# Patient Record
Sex: Male | Born: 1965 | Hispanic: Yes | Marital: Married | Smoking: Never smoker
Health system: Southern US, Community
[De-identification: ages and names within clinical notes are randomized; demographics above are authoritative.]

## PROBLEM LIST (undated history)

## (undated) DIAGNOSIS — Q249 Congenital malformation of heart, unspecified: Secondary | ICD-10-CM

## (undated) DIAGNOSIS — E119 Type 2 diabetes mellitus without complications: Secondary | ICD-10-CM

## (undated) HISTORY — PX: NO PAST SURGERIES: SHX2092

## (undated) HISTORY — DX: Type 2 diabetes mellitus without complications: E11.9

## (undated) HISTORY — DX: Congenital malformation of heart, unspecified: Q24.9

---

## 2009-04-07 DIAGNOSIS — Q249 Congenital malformation of heart, unspecified: Secondary | ICD-10-CM

## 2009-04-07 HISTORY — DX: Congenital malformation of heart, unspecified: Q24.9

## 2009-05-13 ENCOUNTER — Emergency Department: Payer: Self-pay | Admitting: Internal Medicine

## 2009-07-08 ENCOUNTER — Inpatient Hospital Stay: Payer: Self-pay | Admitting: Internal Medicine

## 2010-09-18 IMAGING — CR DG CHEST 1V PORT
1 series · 1 of 1 positions shown · non-contrast
Comparison: none

REASON FOR EXAM: Chest Pain
COMMENTS:

PROCEDURE:     DXR - DXR PORTABLE CHEST SINGLE VIEW  - July 08, 2009 [DATE]
RESULT:     Comparison: None

[view not recorded]
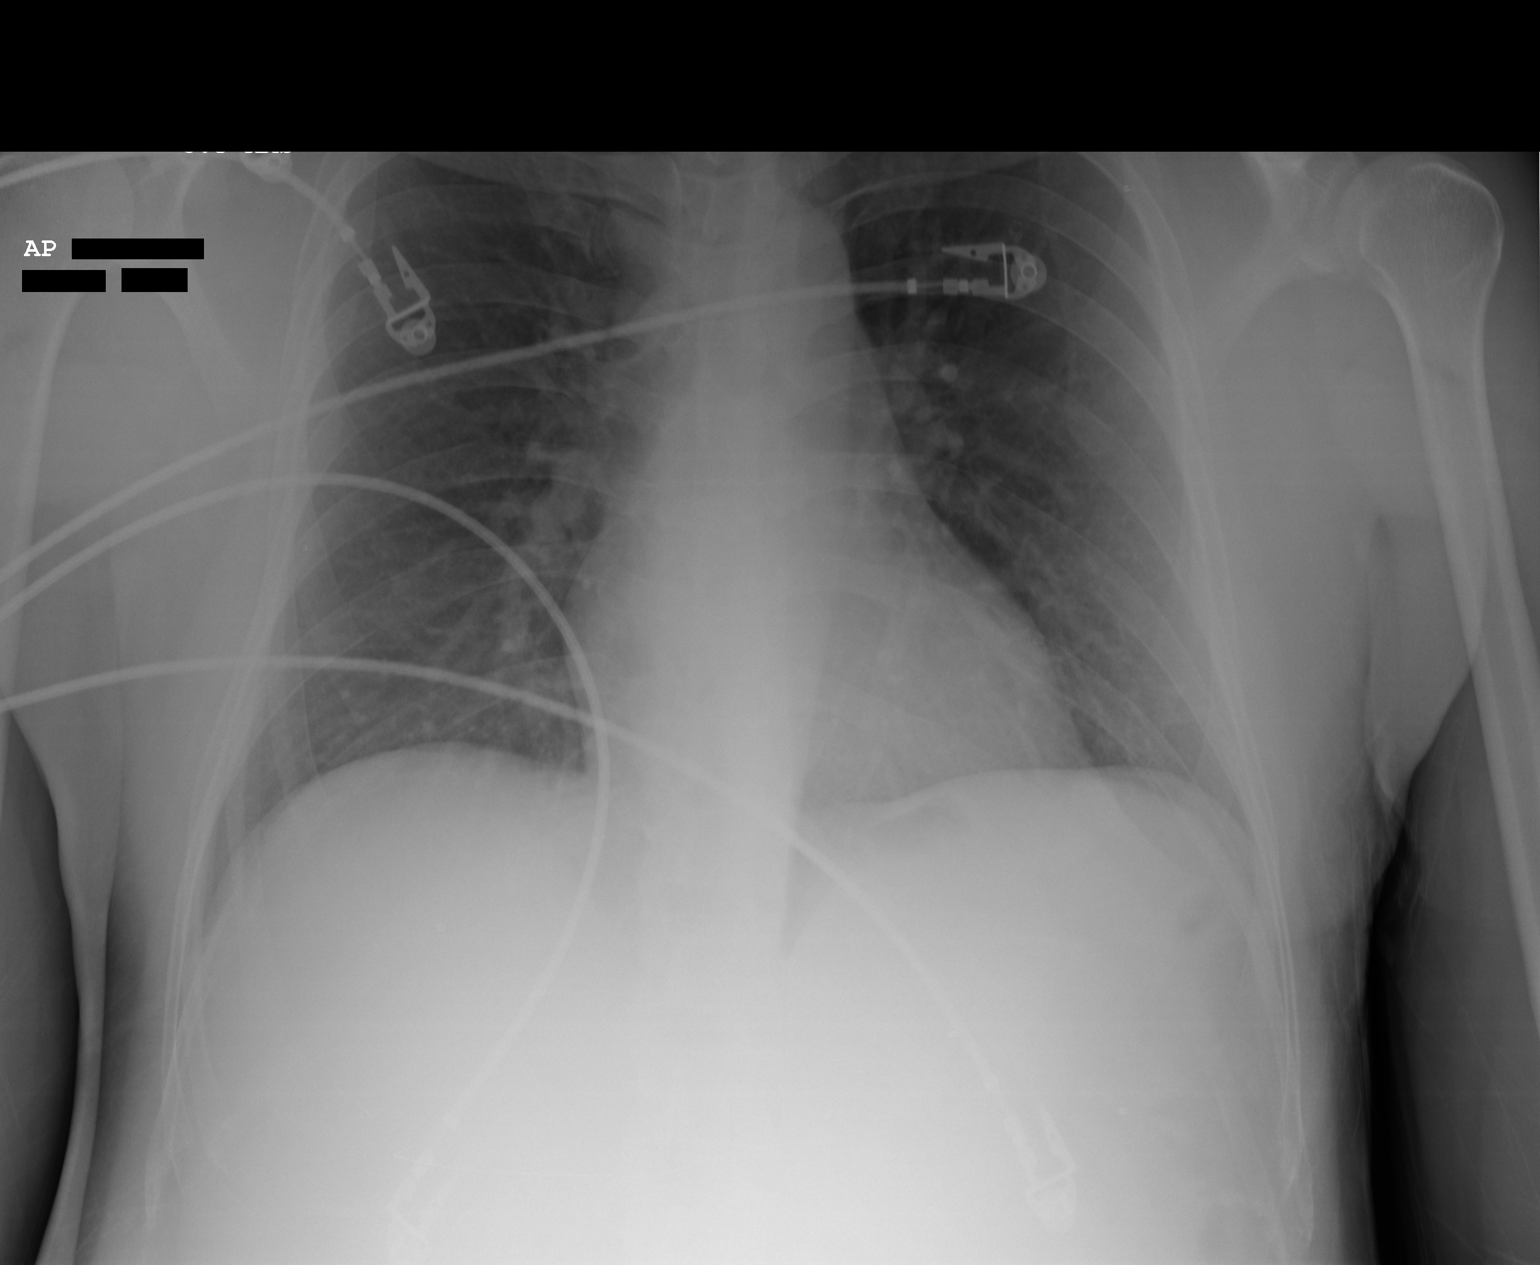

[1 of 1 positions shown; findings below may reference images not displayed]

FINDINGS: Single portable AP chest radiograph is provided. There is no focal
parenchymal opacity, pleural effusion, or pneumothorax. Normal
cardiomediastinal silhouette. The osseous structures are unremarkable.
IMPRESSION: No acute disease of the chest.

## 2014-07-27 DIAGNOSIS — I2510 Atherosclerotic heart disease of native coronary artery without angina pectoris: Secondary | ICD-10-CM | POA: Insufficient documentation

## 2014-07-27 DIAGNOSIS — E785 Hyperlipidemia, unspecified: Secondary | ICD-10-CM | POA: Insufficient documentation

## 2014-07-27 DIAGNOSIS — E119 Type 2 diabetes mellitus without complications: Secondary | ICD-10-CM | POA: Insufficient documentation

## 2017-06-30 ENCOUNTER — Ambulatory Visit: Payer: Self-pay | Admitting: Cardiovascular Disease

## 2017-07-01 ENCOUNTER — Encounter: Payer: Self-pay | Admitting: Cardiovascular Disease

## 2017-12-24 ENCOUNTER — Ambulatory Visit: Payer: BLUE CROSS/BLUE SHIELD | Admitting: Family Medicine

## 2017-12-24 ENCOUNTER — Encounter: Payer: Self-pay | Admitting: Family Medicine

## 2017-12-24 VITALS — BP 130/80 | HR 74 | Temp 98.3°F | Resp 16 | Ht 64.0 in | Wt 140.1 lb

## 2017-12-24 DIAGNOSIS — E785 Hyperlipidemia, unspecified: Secondary | ICD-10-CM | POA: Diagnosis not present

## 2017-12-24 DIAGNOSIS — I2510 Atherosclerotic heart disease of native coronary artery without angina pectoris: Secondary | ICD-10-CM | POA: Diagnosis not present

## 2017-12-24 DIAGNOSIS — Z125 Encounter for screening for malignant neoplasm of prostate: Secondary | ICD-10-CM

## 2017-12-24 DIAGNOSIS — E118 Type 2 diabetes mellitus with unspecified complications: Secondary | ICD-10-CM

## 2017-12-24 DIAGNOSIS — Z1212 Encounter for screening for malignant neoplasm of rectum: Secondary | ICD-10-CM

## 2017-12-24 DIAGNOSIS — Z114 Encounter for screening for human immunodeficiency virus [HIV]: Secondary | ICD-10-CM

## 2017-12-24 DIAGNOSIS — R109 Unspecified abdominal pain: Secondary | ICD-10-CM | POA: Diagnosis not present

## 2017-12-24 DIAGNOSIS — Z1159 Encounter for screening for other viral diseases: Secondary | ICD-10-CM

## 2017-12-24 DIAGNOSIS — Z1211 Encounter for screening for malignant neoplasm of colon: Secondary | ICD-10-CM

## 2017-12-24 DIAGNOSIS — R140 Abdominal distension (gaseous): Secondary | ICD-10-CM | POA: Diagnosis not present

## 2017-12-24 NOTE — Progress Notes (Signed)
Name: Dalton Allen   MRN: [id number]    DOB: 03/21/1966   Date:12/24/2017       Progress Note  Subjective  Chief Complaint  Chief Complaint  Patient presents with  . Establish Care  . Abdominal Pain    bloated and pain    HPI  PT presents to establish care and for the following concerns:  He is spanish speaking only, and an interpreter is present for the entirety of the visit.  Diabetes mellitus type 2 - HE was diagnosed about 10 years ago, was put on medication initially, but controlled with diet and exercise.  Has not had A1C in several years Checking sugars?  no How often? N/A Range (low to high) over last two weeks:  N/A Does patient feel additional teaching/training would be helpful?  no  Have they attended Diabetes education classes? no  Trying to limit white bread, white rice, white potatoes, sweets?  yes Trying to limit sweetened drinks like iced tea, soft drinks, sports drinks, fruit juices?  yes Checking feet every day/night?  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 Most recent A1C: No results found for: HGBA1C  We will recheck today. Last CMP Results : is due today No results found for: NA, K, CL, CO2, GLUCOSE, BUN, CREATININE, CALCIUM, PROT, ALBUMIN, AST, ALT, ALKPHOS, BILITOT, GFRNONAA, GFRAA Urine Micro UTD? No Current Medication Management: Diabetic Medications:  ACEI/ARB: No Statin: No Aspirin therapy: No  Hyperlipidemia: Current Medication Regimen: Last Lipids: No results found for: CHOL, HDL, LDLCALC, LDLDIRECT, TRIG, CHOLHDL - Current Diet: - Denies: Chest pain, shortness of breath, myalgias. - Documented aortic atherosclerosis? No - Risk factors for atherosclerosis: diabetes mellitus and hypercholesterolemia  CAD: He has a noted history of CAD,  has not seen any provider since 2012.  He is not taking any medications.  HE denies chest pain or shortness of breath now.  He notes that back in 2012 he was seeing a cardiologist because he was having intermittent chest pain, but since then, he has not been having chest pain.  It appears he saw Dr. Darrold JunkerParaschos on 07/28/2014, an echocardiogram was recommended but it does not appear that this was performed.  Pt notes he may have had a cardiac cath in the past but is unsure.  Abdominal Pain: He went to [place] 2 months ago, ate some different foods, and had some abdominal pain - took some OTC medication that caused abdominal pain.  When he came back to the [place] the abdominal pain went away, but he is still having some bloating and gas.  He is overdue for colonoscopy - we will refer today and check for H. Pylori.  Health Maintenance:  - HIV & Hep C - to be ordered - PNA Vaccine - declines - Flu vaccine - declines - TDAP vaccine - declines - Colorectal cancer screening: We will order today - Prostate Cancer Screening: We will order today  Patient Active Problem List   Diagnosis Date Noted  . CAD (coronary artery disease) 07/27/2014  . Diabetes (HCC) 07/27/2014  . Hyperlipidemia 07/27/2014    Past Surgical History:  Procedure Laterality Date  . NO PAST SURGERIES      Family History  Problem Relation Age of Onset  . Diabetes Mother  Mother's side has +DM history  . Colon cancer Father 32    Social History   Socioeconomic History  . Marital status: Married    Spouse name: Johna Sheriff  . Number of children: 2  . Years of education: Not on file  . Highest education level: Not on file  Occupational History  . Occupation: warehoused    Employer: [employer]  Social Needs  . Financial resource strain: Not hard at all  . Food insecurity:    Worry: Never true    Inability: Never true  . Transportation needs:    Medical: No    Non-medical: No  Tobacco Use  . Smoking  status: Never Smoker  . Smokeless tobacco: Former Engineer, water and Sexual Activity  . Alcohol use: Never    Frequency: Never  . Drug use: Never  . Sexual activity: Yes  Lifestyle  . Physical activity:    Days per week: 0 days    Minutes per session: 0 min  . Stress: Not at all  Relationships  . Social connections:    Talks on phone: More than three times a week    Gets together: More than three times a week    Attends religious service: More than 4 times per year    Active member of club or organization: No    Attends meetings of clubs or organizations: Never    Relationship status: Married  . Intimate partner violence:    Fear of current or ex partner: No    Emotionally abused: No    Physically abused: No    Forced sexual activity: No  Other Topics Concern  . Not on file  Social History Narrative  . Not on file    No current outpatient medications on file.  No Known Allergies  I personally reviewed active problem list, medication list, allergies, family history, social history, health maintenance with the patient/caregiver today.   ROS Constitutional: Negative for fever or weight change.  Respiratory: Negative for cough and shortness of breath.   Cardiovascular: Negative for chest pain or palpitations.  Gastrointestinal: Negative for abdominal pain, no bowel changes.  Musculoskeletal: Negative for gait problem or joint swelling.  Skin: Negative for rash.  Neurological: Negative for dizziness or headache.  No other specific complaints in a complete review of systems (except as listed in HPI above).  Objective  Vitals:   12/24/17 1008  BP: 130/80  Pulse: 74  Resp: 16  Temp: 98.3 F (36.8 C)  TempSrc: Oral  SpO2: 99%  Weight: 140 lb 1.6 oz (63.5 kg)  Height: 5\' 4"  (1.626 m)   Body mass index is 24.05 kg/m.  Physical Exam Constitutional: Patient appears well-developed and well-nourished. No distress.  HENT: Head: Normocephalic and atraumatic. Ears:  bilateral TMs with no erythema or effusion; Nose: Nose normal. Mouth/Throat: Oropharynx is clear and moist. No oropharyngeal exudate or tonsillar swelling.  Eyes: Conjunctivae and EOM are normal. No scleral icterus.  Pupils are equal, round, and reactive to light.  Neck: Normal range of motion. Neck supple. No JVD present. No thyromegaly present.  Cardiovascular: Normal rate, regular rhythm and normal heart sounds.  No murmur heard. No BLE edema. Pulmonary/Chest: Effort normal and breath sounds normal. No respiratory distress. Abdominal: Soft. Bowel sounds are normal, no distension. There is no tenderness. No masses. Musculoskeletal: Normal range of motion, no joint effusions. No gross deformities Neurological: Pt is alert and oriented to person, place, and time. No cranial nerve deficit. Coordination, balance, strength,  speech and gait are normal.  Skin: Skin is warm and dry. No rash noted. No erythema.  Psychiatric: Patient has a normal mood and affect. behavior is normal. Judgment and thought content normal.  No results found for this or any previous visit (from the past 72 hour(s)).  Diabetic Foot Exam: Diabetic Foot Exam - Simple   Simple Foot Form Diabetic Foot exam was performed with the following findings:  Yes 12/24/2017  2:50 PM  Visual Inspection No deformities, no ulcerations, no other skin breakdown bilaterally:  Yes Sensation Testing Intact to touch and monofilament testing bilaterally:  Yes Pulse Check Posterior Tibialis and Dorsalis pulse intact bilaterally:  Yes Comments    PHQ2/9:  Deferred today, he states is doing well and has no concerns for depression or anxiety today.  Assessment & Plan  1. Coronary artery disease involving native heart without angina pectoris, unspecified vessel or lesion type - Lipid panel  2. Type 2 diabetes mellitus with complication, without long-term current use of insulin (HCC) - COMPLETE METABOLIC PANEL WITH GFR - Hemoglobin A1c -  Ambulatory referral to Ophthalmology - Urine Microalbumin w/creat. Ratio - Discussed importance of 150 minutes of physical activity weekly, eat two servings of fish weekly, eat one serving of tree nuts ( cashews, pistachios, pecans, almonds.Marland Kitchen) every other day, eat 6 servings of fruit/vegetables daily and drink plenty of water and avoid sweet beverages.   3. Hyperlipidemia, unspecified hyperlipidemia type - Lipid panel  4. Abdominal discomfort - COMPLETE METABOLIC PANEL WITH GFR - CBC - H. pylori breath test  5. Bloating - H. pylori breath test  6. Need for hepatitis C screening test - Hepatitis C antibody  7. Encounter for screening for HIV - HIV Antibody (routine testing w rflx)  8. Screening for colorectal cancer - Ambulatory referral to Gastroenterology  9. Prostate cancer screening - PSA

## 2017-12-25 ENCOUNTER — Other Ambulatory Visit: Payer: Self-pay | Admitting: Family Medicine

## 2017-12-25 DIAGNOSIS — E782 Mixed hyperlipidemia: Secondary | ICD-10-CM

## 2017-12-25 LAB — COMPLETE METABOLIC PANEL WITH GFR
AG Ratio: 1.5 (calc) (ref 1.0–2.5)
ALT: 33 U/L (ref 9–46)
AST: 23 U/L (ref 10–35)
Albumin: 4.4 g/dL (ref 3.6–5.1)
Alkaline phosphatase (APISO): 97 U/L (ref 40–115)
BILIRUBIN TOTAL: 0.5 mg/dL (ref 0.2–1.2)
BUN: 17 mg/dL (ref 7–25)
CALCIUM: 9.4 mg/dL (ref 8.6–10.3)
CHLORIDE: 101 mmol/L (ref 98–110)
CO2: 30 mmol/L (ref 20–32)
Creat: 0.91 mg/dL (ref 0.70–1.33)
GFR, EST NON AFRICAN AMERICAN: 96 mL/min/{1.73_m2} (ref >=60)
GFR, Est African American: 111 mL/min/{1.73_m2} (ref >=60)
GLUCOSE: 93 mg/dL (ref 65–99)
Globulin: 2.9 g/dL (calc) (ref 1.9–3.7)
Potassium: 4.3 mmol/L (ref 3.5–5.3)
Sodium: 139 mmol/L (ref 135–146)
Total Protein: 7.3 g/dL (ref 6.1–8.1)

## 2017-12-25 LAB — LIPID PANEL
CHOL/HDL RATIO: 4.6 (calc) (ref <5.0)
Cholesterol: 162 mg/dL (ref <200)
HDL: 35 mg/dL — ABNORMAL LOW (ref >40)
LDL Cholesterol (Calc): 105 mg/dL (calc) — ABNORMAL HIGH
NON-HDL CHOLESTEROL (CALC): 127 mg/dL (ref <130)
Triglycerides: 123 mg/dL (ref <150)

## 2017-12-25 LAB — CBC
HCT: 47.6 % (ref 38.5–50.0)
Hemoglobin: 16.2 g/dL (ref 13.2–17.1)
MCH: 29.3 pg (ref 27.0–33.0)
MCHC: 34 g/dL (ref 32.0–36.0)
MCV: 86.2 fL (ref 80.0–100.0)
MPV: 10.8 fL (ref 7.5–12.5)
Platelets: 266 10*3/uL (ref 140–400)
RBC: 5.52 10*6/uL (ref 4.20–5.80)
RDW: 12.5 % (ref 11.0–15.0)
WBC: 5.4 10*3/uL (ref 3.8–10.8)

## 2017-12-25 LAB — H. PYLORI BREATH TEST: H. pylori Breath Test: NOT DETECTED

## 2017-12-25 LAB — MICROALBUMIN / CREATININE URINE RATIO
Creatinine, Urine: 164 mg/dL (ref 20–320)
MICROALB UR: 0.3 mg/dL
MICROALB/CREAT RATIO: 2 ug/mg{creat} (ref <30)

## 2017-12-25 LAB — HEPATITIS C ANTIBODY
HEP C AB: NONREACTIVE
SIGNAL TO CUT-OFF: 0.02 (ref <1.00)

## 2017-12-25 LAB — PSA: PSA: 1.5 ng/mL (ref <=4.0)

## 2017-12-25 LAB — HEMOGLOBIN A1C
Hgb A1c MFr Bld: 6.2 % of total Hgb — ABNORMAL HIGH (ref <5.7)
Mean Plasma Glucose: 131 (calc)
eAG (mmol/L): 7.3 (calc)

## 2017-12-25 LAB — HIV ANTIBODY (ROUTINE TESTING W REFLEX): HIV 1&2 Ab, 4th Generation: NONREACTIVE

## 2017-12-25 MED ORDER — ASPIRIN 81 MG PO TABS: 81.0000 mg | ORAL_TABLET | Freq: Every day | ORAL | 1 refills | Status: DC

## 2017-12-25 MED ORDER — ATORVASTATIN CALCIUM 40 MG PO TABS: 40.0000 mg | ORAL_TABLET | Freq: Every day | ORAL | 1 refills | Status: DC

## 2018-01-22 ENCOUNTER — Encounter: Payer: BLUE CROSS/BLUE SHIELD | Admitting: Family Medicine

## 2018-01-26 ENCOUNTER — Encounter: Payer: Self-pay | Admitting: *Deleted

## 2018-01-26 ENCOUNTER — Ambulatory Visit (INDEPENDENT_AMBULATORY_CARE_PROVIDER_SITE_OTHER): Payer: BLUE CROSS/BLUE SHIELD | Admitting: Family Medicine

## 2018-01-26 ENCOUNTER — Encounter: Payer: Self-pay | Admitting: Family Medicine

## 2018-01-26 VITALS — BP 128/76 | HR 88 | Temp 98.2°F | Ht 64.0 in | Wt 141.3 lb

## 2018-01-26 DIAGNOSIS — Z1212 Encounter for screening for malignant neoplasm of rectum: Secondary | ICD-10-CM | POA: Diagnosis not present

## 2018-01-26 DIAGNOSIS — Z1211 Encounter for screening for malignant neoplasm of colon: Secondary | ICD-10-CM

## 2018-01-26 DIAGNOSIS — Z0000 Encounter for general adult medical examination without abnormal findings: Secondary | ICD-10-CM | POA: Diagnosis not present

## 2018-01-26 NOTE — Patient Instructions (Addendum)
Take one 69m Aspirin tablet once daily Take one 46mAtorvastatin once daily Preventive Care 40-64 Years, Male Preventive care refers to lifestyle choices and visits with your health care provider that can promote health and wellness. What does preventive care include?  A yearly physical exam. This is also called an annual well check.  Dental exams once or twice a year.  Routine eye exams. Ask your health care provider how often you should have your eyes checked.  Personal lifestyle choices, including: ? Daily care of your teeth and gums. ? Regular physical activity. ? Eating a healthy diet. ? Avoiding tobacco and drug use. ? Limiting alcohol use. ? Practicing safe sex. ? Taking low-dose aspirin every day starting at age 650What happens during an annual well check? The services and screenings done by your health care provider during your annual well check will depend on your age, overall health, lifestyle risk factors, and family history of disease. Counseling Your health care provider may ask you questions about your:  Alcohol use.  Tobacco use.  Drug use.  Emotional well-being.  Home and relationship well-being.  Sexual activity.  Eating habits.  Work and work enStatistician Screening You may have the following tests or measurements:  Height, weight, and BMI.  Blood pressure.  Lipid and cholesterol levels. These may be checked every 5 years, or more frequently if you are over 5055ears old.  Skin check.  Lung cancer screening. You may have this screening every year starting at age 5525f you have a 30-pack-year history of smoking and currently smoke or have quit within the past 15 years.  Fecal occult blood test (FOBT) of the stool. You may have this test every year starting at age 671 Flexible sigmoidoscopy or colonoscopy. You may have a sigmoidoscopy every 5 years or a colonoscopy every 10 years starting at age 614 Prostate cancer screening.  Recommendations will vary depending on your family history and other risks.  Hepatitis C blood test.  Hepatitis B blood test.  Sexually transmitted disease (STD) testing.  Diabetes screening. This is done by checking your blood sugar (glucose) after you have not eaten for a while (fasting). You may have this done every 1-3 years.  Discuss your test results, treatment options, and if necessary, the need for more tests with your health care provider. Vaccines Your health care provider may recommend certain vaccines, such as:  Influenza vaccine. This is recommended every year.  Tetanus, diphtheria, and acellular pertussis (Tdap, Td) vaccine. You may need a Td booster every 10 years.  Varicella vaccine. You may need this if you have not been vaccinated.  Zoster vaccine. You may need this after age 53 Measles, mumps, and rubella (MMR) vaccine. You may need at least one dose of MMR if you were born in 1957 or later. You may also need a second dose.  Pneumococcal 13-valent conjugate (PCV13) vaccine. You may need this if you have certain conditions and have not been vaccinated.  Pneumococcal polysaccharide (PPSV23) vaccine. You may need one or two doses if you smoke cigarettes or if you have certain conditions.  Meningococcal vaccine. You may need this if you have certain conditions.  Hepatitis A vaccine. You may need this if you have certain conditions or if you travel or work in places where you may be exposed to hepatitis A.  Hepatitis B vaccine. You may need this if you have certain conditions or if you travel or work in places where you  may be exposed to hepatitis B.  Haemophilus influenzae type b (Hib) vaccine. You may need this if you have certain risk factors.  Talk to your health care provider about which screenings and vaccines you need and how often you need them. This information is not intended to replace advice given to you by your health care provider. Make sure you  discuss any questions you have with your health care provider. Document Released: 04/20/2015 Document Revised: 12/12/2015 Document Reviewed: 01/23/2015 Elsevier Interactive Patient Education  [employer].

## 2018-01-26 NOTE — Progress Notes (Signed)
Name: Dalton Allen   MRN: [id number]    DOB: Jun 07, 1965   Date:01/26/2018       Progress Note  Subjective  Chief Complaint  Chief Complaint  Patient presents with  . Annual Exam    HPI  Patient presents for annual CPE.  Pt denies complaints or concerns, per chart patient was prescribed atorvastatin in September but patient states he did not get it and has not started this medication - discussed starting that today, answered all questions.  USPSTF grade A and B recommendations:  Diet: Reports he eats mostly chicken, rice and tortilla, also lots of vegetables when he can- he does not eat them regularly, denies recent diet changes. Encouraged decreasing carbohydrates due to diabetes and limiting fat intake. Exercise: Denies exercise routine but has a physical job  Depression:  Depression screen [facility] 2/9 01/26/2018  Decreased Interest 0  Down, Depressed, Hopeless 0  PHQ - 2 Score 0    Hypertension:  BP Readings from Last 3 Encounters:  01/26/18 128/76  12/24/17 130/80    Obesity: Wt Readings from Last 3 Encounters:  01/26/18 141 lb 4.8 oz (64.1 kg)  12/24/17 140 lb 1.6 oz (63.5 kg)   BMI Readings from Last 3 Encounters:  01/26/18 24.25 kg/m  12/24/17 24.05 kg/m     Lipids:  Lab Results  Component Value Date   CHOL 162 12/24/2017   Lab Results  Component Value Date   HDL 35 (L) 12/24/2017   Lab Results  Component Value Date   LDLCALC 105 (H) 12/24/2017   Lab Results  Component Value Date   TRIG 123 12/24/2017   Lab Results  Component Value Date   CHOLHDL 4.6 12/24/2017   No results found for: LDLDIRECT Glucose:  Glucose, Bld  Date Value Ref Range Status  12/24/2017 93 65 - 99 mg/dL Final    Comment:    .            Fasting reference interval .       [facility] Visit from 01/26/2018 in [facility]  AUDIT-C Score  0     Married STD testing and prevention (HIV/chl/gon/syphilis): Completed Sept 2019 Hep C: Completed Sept  2019 Skin cancer: Denies any concerning lesions or moles. Discussed using sunscreen. Pt reports he has been itching to his bilateral axillary areas since changing shampoos, no redness or rash noted, pt changed shampoos and felt like his skin dried out more. Colorectal cancer: Pt denies any changes in bowel movements, denies dark or tarry stools. Patient reports his father passed away after a battle with colon cancer at age 87-76. Colonoscopy is ordered today Prostate cancer: Pt denies family history of prostate cancer, reports a weak urinary stream and states he "urinates very slow", denies pain with urination  Lab Results  Component Value Date   PSA 1.5 12/24/2017   IPSS Questionnaire (AUA-7): Over the past month.   1)  How often have you had a sensation of not emptying your bladder completely after you finish urinating?  0 - Not at all  2)  How often have you had to urinate again less than two hours after you finished urinating? 1 - Less than 1 time in 5  3)  How often have you found you stopped and started again several times when you urinated?  1 - Less than 1 time in 5  4) How difficult have you found it to postpone urination?  0 - Not at all  5) How often have  you had a weak urinary stream?  5 - Almost always  6) How often have you had to push or strain to begin urination?  1 - Less than 1 time in 5  7) How many times did you most typically get up to urinate from the time you went to bed until the time you got up in the morning?  0 - None  Total score:  0-7 mildly symptomatic   8-19 moderately symptomatic   20-35 severely symptomatic   Lung cancer:  Low Dose CT Chest recommended if Age 21-80 years, 30 pack-year currently smoking OR have quit w/in 15years. Patient does not qualify.   ECG:  Baseline on file from 2011; denies chest pain, shortness of breath, or palpitations.   Advanced Care Planning: A voluntary discussion about advance care planning including the explanation and  discussion of advance directives.  Discussed health care proxy and Living will, and the patient was able to identify a health care proxy as his wife.  Patient does not have a living will at present time. If patient does have living will, I have requested they bring this to the clinic to be scanned in to their chart.  Patient Active Problem List   Diagnosis Date Noted  . CAD (coronary artery disease) 07/27/2014  . Diabetes (HCC) 07/27/2014  . Hyperlipidemia 07/27/2014    Past Surgical History:  Procedure Laterality Date  . NO PAST SURGERIES      Family History  Problem Relation Age of Onset  . Diabetes Mother        Mother's side has +DM history  . Colon cancer Father 26    Social History   Socioeconomic History  . Marital status: Married    Spouse name: Johna Sheriff  . Number of children: 2  . Years of education: Not on file  . Highest education level: Not on file  Occupational History  . Occupation: warehoused    Employer: [employer]  Social Needs  . Financial resource strain: Not hard at all  . Food insecurity:    Worry: Never true    Inability: Never true  . Transportation needs:    Medical: No    Non-medical: No  Tobacco Use  . Smoking status: Never Smoker  . Smokeless tobacco: Former Engineer, water and Sexual Activity  . Alcohol use: Never    Frequency: Never  . Drug use: Never  . Sexual activity: Yes  Lifestyle  . Physical activity:    Days per week: 0 days    Minutes per session: 0 min  . Stress: Not at all  Relationships  . Social connections:    Talks on phone: More than three times a week    Gets together: More than three times a week    Attends religious service: More than 4 times per year    Active member of club or organization: No    Attends meetings of clubs or organizations: Never    Relationship status: Married  . Intimate partner violence:    Fear of current or ex partner: No    Emotionally abused: No    Physically abused: No     Forced sexual activity: No  Other Topics Concern  . Not on file  Social History Narrative  . Not on file     Current Outpatient Medications:  .  aspirin 81 MG tablet, Take 1 tablet (81 mg total) by mouth daily. (Patient not taking: Reported on 01/26/2018), Disp: 90 tablet, Rfl: 1 .  atorvastatin (LIPITOR) 40 MG tablet, Take 1 tablet (40 mg total) by mouth daily. (Patient not taking: Reported on 01/26/2018), Disp: 90 tablet, Rfl: 1  No Known Allergies   ROS  Constitutional: Negative for fever or weight change.  Respiratory: Negative for cough and shortness of breath.   Cardiovascular: Negative for chest pain or palpitations.  Gastrointestinal: Negative for abdominal pain, no bowel changes.  Musculoskeletal: Negative for gait problem or joint swelling.  Skin: Negative for rash.  Neurological: Negative for dizziness or headache.  No other specific complaints in a complete review of systems (except as listed in HPI above).    Objective  Vitals:   01/26/18 1118  BP: 128/76  Pulse: 88  Temp: 98.2 F (36.8 C)  SpO2: 97%  Weight: 141 lb 4.8 oz (64.1 kg)  Height: 5\' 4"  (1.626 m)    Body mass index is 24.25 kg/m.  Physical Exam Constitutional: Patient appears well-developed and well-nourished. No distress.  HENT: Head: Normocephalic and atraumatic. Ears: B TMs ok, no erythema or effusion; Nose: Nose normal. Mouth/Throat: Oropharynx is clear and moist. No oropharyngeal exudate.  Eyes: Conjunctivae and EOM are normal. Pupils are equal, round, and reactive to light. No scleral icterus.  Neck: Normal range of motion. Neck supple. No JVD present. No thyromegaly present.  Cardiovascular: Normal rate, regular rhythm and normal heart sounds.  No murmur heard. No BLE edema. Pulmonary/Chest: Effort normal and breath sounds normal. No respiratory distress. Abdominal: Soft. Bowel sounds are normal, no distension. There is no tenderness. no masses MALE GENITALIA: Deferred RECTAL:  Deferred Musculoskeletal: Normal range of motion, no joint effusions. No gross deformities Neurological: he is alert and oriented to person, place, and time. No cranial nerve deficit. Coordination, balance, strength, speech and gait are normal.  Skin: Skin is warm and dry. No rash noted - specifically, no appreciable rash to bilateral axilla. No erythema.  Psychiatric: Patient has a normal mood and affect. behavior is normal. Judgment and thought content normal.  Recent Results (from the past 2160 hour(s))  Lipid panel     Status: Abnormal   Collection Time: 12/24/17 11:26 AM  Result Value Ref Range   Cholesterol 162 <200 mg/dL   HDL 35 (L) >40 mg/dL   Triglycerides 981 <191 mg/dL   LDL Cholesterol (Calc) 105 (H) mg/dL (calc)    Comment: Reference range: <100 . Desirable range <100 mg/dL for primary prevention;   <70 mg/dL for patients with CHD or diabetic patients  with > or = 2 CHD risk factors. Marland Kitchen LDL-C is now calculated using the Martin-Hopkins  calculation, which is a validated novel method providing  better accuracy than the Friedewald equation in the  estimation of LDL-C.  Horald Pollen et al. Lenox Ahr. 4782;956(21): 2061-2068  (http://education.[facility].com/faq/FAQ164)    Total CHOL/HDL Ratio 4.6 <5.0 (calc)   Non-HDL Cholesterol (Calc) 127 <130 mg/dL (calc)    Comment: For patients with diabetes plus 1 major ASCVD risk  factor, treating to a non-HDL-C goal of <100 mg/dL  (LDL-C of <30 mg/dL) is considered a therapeutic  option.   COMPLETE METABOLIC PANEL WITH GFR     Status: None   Collection Time: 12/24/17 11:26 AM  Result Value Ref Range   Glucose, Bld 93 65 - 99 mg/dL    Comment: .            Fasting reference interval .    BUN 17 7 - 25 mg/dL   Creat 8.65 7.84 - 6.96 mg/dL    Comment: For  patients >55 years of age, the reference limit for Creatinine is approximately 13% higher for people identified as African-American. .    GFR, Est Non African American 96  > OR = 60 mL/min/1.68m2   GFR, Est African American 111 > OR = 60 mL/min/1.84m2   BUN/Creatinine Ratio NOT APPLICABLE 6 - 22 (calc)   Sodium 139 135 - 146 mmol/L   Potassium 4.3 3.5 - 5.3 mmol/L   Chloride 101 98 - 110 mmol/L   CO2 30 20 - 32 mmol/L   Calcium 9.4 8.6 - 10.3 mg/dL   Total Protein 7.3 6.1 - 8.1 g/dL   Albumin 4.4 3.6 - 5.1 g/dL   Globulin 2.9 1.9 - 3.7 g/dL (calc)   AG Ratio 1.5 1.0 - 2.5 (calc)   Total Bilirubin 0.5 0.2 - 1.2 mg/dL   Alkaline phosphatase (APISO) 97 40 - 115 U/L   AST 23 10 - 35 U/L   ALT 33 9 - 46 U/L  CBC     Status: None   Collection Time: 12/24/17 11:26 AM  Result Value Ref Range   WBC 5.4 3.8 - 10.8 Thousand/uL   RBC 5.52 4.20 - 5.80 Million/uL   Hemoglobin 16.2 13.2 - 17.1 g/dL   HCT 40.9 81.1 - 91.4 %   MCV 86.2 80.0 - 100.0 fL   MCH 29.3 27.0 - 33.0 pg   MCHC 34.0 32.0 - 36.0 g/dL   RDW 78.2 95.6 - 21.3 %   Platelets 266 140 - 400 Thousand/uL   MPV 10.8 7.5 - 12.5 fL  Hemoglobin A1c     Status: Abnormal   Collection Time: 12/24/17 11:26 AM  Result Value Ref Range   Hgb A1c MFr Bld 6.2 (H) <5.7 % of total Hgb    Comment: For someone without known diabetes, a hemoglobin  A1c value between 5.7% and 6.4% is consistent with prediabetes and should be confirmed with a  follow-up test. . For someone with known diabetes, a value <7% indicates that their diabetes is well controlled. A1c targets should be individualized based on duration of diabetes, age, comorbid conditions, and other considerations. . This assay result is consistent with an increased risk of diabetes. . Currently, no consensus exists regarding use of hemoglobin A1c for diagnosis of diabetes for children. .    Mean Plasma Glucose 131 (calc)   eAG (mmol/L) 7.3 (calc)  Urine Microalbumin w/creat. ratio     Status: None   Collection Time: 12/24/17 11:26 AM  Result Value Ref Range   Creatinine, Urine 164 20 - 320 mg/dL   Microalb, Ur 0.3 mg/dL    Comment: Reference  Range Not established    Microalb Creat Ratio 2 <30 mcg/mg creat    Comment: . The ADA defines abnormalities in albumin excretion as follows: Marland Kitchen Category         Result (mcg/mg creatinine) . Normal                    <30 Microalbuminuria         30-299  Clinical albuminuria   > OR = 300 . The ADA recommends that at least two of three specimens collected within a 3-6 month period be abnormal before considering a patient to be within a diagnostic category.   HIV Antibody (routine testing w rflx)     Status: None   Collection Time: 12/24/17 11:26 AM  Result Value Ref Range   HIV 1&2 Ab, 4th Generation NON-REACTIVE NON-REACTI    Comment:  HIV-1 antigen and HIV-1/HIV-2 antibodies were not detected. There is no laboratory evidence of HIV infection. Marland Kitchen PLEASE NOTE: This information has been disclosed to you from records whose confidentiality may be protected by state law.  If your state requires such protection, then the state law prohibits you from making any further disclosure of the information without the specific written consent of the person to whom it pertains, or as otherwise permitted by law. A general authorization for the release of medical or other information is NOT sufficient for this purpose. . For additional information please refer to http://education.[facility].com/faq/FAQ106 (This link is being provided for informational/ educational purposes only.) . Marland Kitchen The performance of this assay has not been clinically validated in patients less than 47 years old. .   Hepatitis C antibody     Status: None   Collection Time: 12/24/17 11:26 AM  Result Value Ref Range   Hepatitis C Ab NON-REACTIVE NON-REACTI   SIGNAL TO CUT-OFF 0.02 <1.00    Comment: . HCV antibody was non-reactive. There is no laboratory  evidence of HCV infection. . In most cases, no further action is required. However, if recent HCV exposure is suspected, a test for HCV RNA (test code 16109)  is suggested. . For additional information please refer to http://education.[facility].com/faq/FAQ22v1 (This link is being provided for informational/ educational purposes only.) .   H. pylori breath test     Status: None   Collection Time: 12/24/17 11:26 AM  Result Value Ref Range   H. pylori Breath Test NOT DETECTED NOT DETECT    Comment: . Antimicrobials, proton pump inhibitors, and bismuth preparations are known to suppress H. pylori, and  ingestion of these prior to H. pylori diagnostic testing may lead to false negative results. If clinically  indicated, the test may be repeated on a new specimen obtained two weeks after discontinuing treatment. However, a positive result is still clinically valid.   PSA     Status: None   Collection Time: 12/24/17 11:26 AM  Result Value Ref Range   PSA 1.5 < OR = 4.0 ng/mL    Comment: The total PSA value from this assay system is  standardized against the WHO standard. The test  result will be approximately 20% lower when compared  to the equimolar-standardized total PSA (Beckman  Coulter). Comparison of serial PSA results should be  interpreted with this fact in mind. . This test was performed using the Siemens  chemiluminescent method. Values obtained from  different assay methods cannot be used interchangeably. PSA levels, regardless of value, should not be interpreted as absolute evidence of the presence or absence of disease.    PHQ2/9: Depression screen PHQ 2/9 01/26/2018  Decreased Interest 0  Down, Depressed, Hopeless 0  PHQ - 2 Score 0    Fall Risk: Fall Risk  01/26/2018  Falls in the past year? No    Functional Status Survey: Is the patient deaf or have difficulty hearing?: No Does the patient have difficulty seeing, even when wearing glasses/contacts?: No Does the patient have difficulty concentrating, remembering, or making decisions?: No Does the patient have difficulty walking or climbing stairs?:  No Does the patient have difficulty dressing or bathing?: No Does the patient have difficulty doing errands alone such as visiting a doctor's office or shopping?: No  Assessment & Plan  1. Annual physical exam -Prostate cancer screening and PSA options (with potential risks and benefits of testing vs not testing) were discussed along with recent recs/guidelines. -USPSTF grade A and  B recommendations reviewed with patient; age-appropriate recommendations, preventive care, screening tests, etc discussed and encouraged; healthy living encouraged; see AVS for patient education given to patient -Discussed importance of 150 minutes of physical activity weekly, eat two servings of fish weekly, eat one serving of tree nuts ( cashews, pistachios, pecans, almonds.Marland Kitchen) every other day, eat 6 servings of fruit/vegetables daily and drink plenty of water and avoid sweet beverages.   2. Screening for colorectal cancer - Message sent to Iva Lento to reach back out to patient to schedule colonoscopy now that he has his work schedule

## 2018-03-08 ENCOUNTER — Telehealth: Payer: Self-pay | Admitting: Family Medicine

## 2018-03-08 NOTE — Telephone Encounter (Signed)
Copied from CRM [phone]. Topic: Quick Communication - See Telephone Encounter >> Mar 08, 2018  4:51 PM Lorrine KinMcGee, Demarquis Osley B, NT wrote: CRM for notification. See Telephone encounter for: 03/08/18. Linton Flemingsmilio Martinez, patient's friend, calling and would like someone to call the patient regarding his colorectal screening. States that who ever he talked to to explain the procedure got frustrated and impatient due to the language barrier and education level of the patient. Looking at the referral notes, it could've been when [facility] reached out to schedule. States that he does have many questions and that he has to be 100% sure of what all is going to happen before he will agree to the procedure. The patient would like a call, with an interpreter, to explain what will be happening.  CB#: [phone] Marlowe Sax(Emilio)      [phone] (Mr Alger MemosLeiva)

## 2018-03-08 NOTE — Telephone Encounter (Signed)
Copied from CRM #[id number]. Topic: Quick Communication - See Telephone Encounter >> Mar 08, 2018  4:51 PM Jaycee Pelzer B, NT wrote: CRM for notification. See Telephone encounter for: 03/08/18. Emilio Martinez, patient's friend, calling and would like someone to call the patient regarding his colorectal screening. States that who ever he talked to to explain the procedure got frustrated and impatient due to the language barrier and education level of the patient. Looking at the referral notes, it could've been when [facility] reached out to schedule. States that he does have many questions and that he has to be 100% sure of what all is going to happen before he will agree to the procedure. The patient would like a call, with an interpreter, to explain what will be happening.  CB#: [phone] (Emilio)      [phone] (Mr Gowans) 

## 2018-03-09 ENCOUNTER — Telehealth: Payer: Self-pay

## 2018-03-09 NOTE — Telephone Encounter (Signed)
Dalton Allen Dalton Allen at [facility] will reach out to patient for appointment

## 2018-03-09 NOTE — Telephone Encounter (Signed)
Spoke to Decloameka at [facility] will have office call back again through interpreter line.

## 2018-03-09 NOTE — Telephone Encounter (Signed)
Contacted patient with the assistance of Interpreter Services Gavin PoundDeborah [phone] to discuss scheduling him for his colonoscopy.  Previously discussed colonoscopy with patient upon receiving his referral for colonoscopy.  He had many questions regarding his colonoscopy and did not clearly understand my response to his questions.  Patient was offered an office visit to discuss in more detail his colonoscopy and concerns regarding colonoscopy.  He declined an office visit.  I explained to him that I offered an office visit to ensure that his questions and concerns were addressed prior to colonoscopy being scheduled.  Today I received a message that patient wanted to schedule his colonoscopy.  I contacted him again with the assistance of interpreter, and advised him that if I am unable to answer his questions and concerns regarding his colonoscopy I will offer an appt to discuss in person.  Thank you, Marcelino DusterMichelle CMA

## 2018-06-28 ENCOUNTER — Ambulatory Visit: Payer: BLUE CROSS/BLUE SHIELD | Admitting: Family Medicine

## 2018-12-28 DIAGNOSIS — E119 Type 2 diabetes mellitus without complications: Secondary | ICD-10-CM | POA: Diagnosis not present

## 2018-12-28 DIAGNOSIS — Z1389 Encounter for screening for other disorder: Secondary | ICD-10-CM | POA: Diagnosis not present

## 2019-01-05 DIAGNOSIS — Z1211 Encounter for screening for malignant neoplasm of colon: Secondary | ICD-10-CM | POA: Diagnosis not present

## 2019-01-25 DIAGNOSIS — N471 Phimosis: Secondary | ICD-10-CM | POA: Diagnosis not present

## 2019-01-25 DIAGNOSIS — E119 Type 2 diabetes mellitus without complications: Secondary | ICD-10-CM | POA: Diagnosis not present

## 2019-03-16 ENCOUNTER — Other Ambulatory Visit: Payer: Self-pay

## 2019-03-16 ENCOUNTER — Ambulatory Visit: Payer: BC Managed Care – PPO | Admitting: Urology

## 2019-03-16 ENCOUNTER — Encounter: Payer: Self-pay | Admitting: Urology

## 2019-03-16 VITALS — BP 150/92 | HR 81 | Ht 62.0 in | Wt 141.8 lb

## 2019-03-16 DIAGNOSIS — N471 Phimosis: Secondary | ICD-10-CM

## 2019-03-16 NOTE — Progress Notes (Signed)
03/16/2019 10:02 AM   Dalton Allen [birth year] [id number]  Referring provider: Doren Custard, FNP [address] [place],  [place] [address]  Chief Complaint  Patient presents with  . phimosis    HPI: Dalton Allen is a 53 y.o. male seen in consultation at the request of Dr. Ephraim Hamburger for evaluation of phimosis.  He presents with an approximately 1 year history of difficulty retracting his foreskin and for the past 1 month has been unable to retract his foreskin.  He does complain of some pain with intercourse.  He has mild dysuria at times.  He does have type 2 diabetes which is diet controlled.  He was treated with an oral and topical antifungal without improvement.   PMH: Past Medical History:  Diagnosis Date  . Diabetes mellitus without complication (HCC)   . Heart abnormality 2011    Surgical History: Past Surgical History:  Procedure Laterality Date  . NO PAST SURGERIES      Home Medications:  Allergies as of 03/16/2019      Reactions   Beef Allergy    Other reaction(s): Unknown   Pork Allergy    Other reaction(s): Unknown      Medication List       Accurate as of March 16, 2019 10:02 AM. If you have any questions, ask your nurse or doctor.        STOP taking these medications   aspirin 81 MG tablet Stopped by: Riki Altes, MD   atorvastatin 40 MG tablet Commonly known as: LIPITOR Stopped by: Riki Altes, MD       Allergies:  Allergies  Allergen Reactions  . Beef Allergy     Other reaction(s): Unknown  . Pork Allergy     Other reaction(s): Unknown    Family History: Family History  Problem Relation Age of Onset  . Diabetes Mother        Mother's side has +DM history  . Colon cancer Father 77    Social History:  reports that he has never smoked. He has quit using smokeless tobacco. He reports that he does not drink alcohol or use drugs.  ROS: UROLOGY Frequent Urination?: No Hard to postpone urination?: No Burning/pain  with urination?: Yes Get up at night to urinate?: No Leakage of urine?: No Urine stream starts and stops?: No Trouble starting stream?: No Do you have to strain to urinate?: No Blood in urine?: No Urinary tract infection?: Yes Sexually transmitted disease?: No Injury to kidneys or bladder?: No Painful intercourse?: Yes Weak stream?: Yes Erection problems?: No Penile pain?: No  Gastrointestinal Nausea?: No Vomiting?: No Indigestion/heartburn?: No Diarrhea?: No Constipation?: Yes  Constitutional Fever: No Night sweats?: No Weight loss?: No Fatigue?: No  Skin Skin rash/lesions?: No Itching?: No  Eyes Blurred vision?: No Double vision?: No  Ears/Nose/Throat Sore throat?: No Sinus problems?: No  Hematologic/Lymphatic Swollen glands?: No Easy bruising?: No  Cardiovascular Leg swelling?: No Chest pain?: No  Respiratory Cough?: No Shortness of breath?: No  Endocrine Excessive thirst?: No  Musculoskeletal Back pain?: No Joint pain?: No  Neurological Headaches?: No Dizziness?: No  Psychologic Depression?: No Anxiety?: No  Physical Exam: BP (!) 150/92 (BP Location: Left Arm, Patient Position: Sitting, Cuff Size: Normal)   Pulse 81   Ht 5\' 2"  (1.575 m)   Wt 141 lb 12.8 oz (64.3 kg)   BMI 25.94 kg/m   Constitutional:  Alert and oriented, No acute distress. HEENT: [place] AT, moist mucus membranes.  Trachea midline, no masses. Cardiovascular: No clubbing, cyanosis, or edema. Respiratory: Normal respiratory effort, no increased work of breathing. GI: Abdomen is soft, nontender, nondistended, no abdominal masses GU: Phallus uncircumcised.  Unable to retract foreskin though the distal glans is visualized and meatus is normal.  Testes descended bilaterally. Skin: No rashes, bruises or suspicious lesions. Neurologic: Grossly intact, no focal deficits, moving all 4 extremities. Psychiatric: Normal mood and affect.   Assessment & Plan:    - Phimosis  Management options were discussed including a trial of topical steroid, circumcision and dorsal slit.  He does not desire surgical options that would leave his glans exposed or partially exposed.  Will initially give a trial of triamcinolone cream.  Will check with reconstructive urology at [facility] to see if there is anything surgically this patient could be offered.    Abbie Sons, [employer] [address], [place], [place] [address] [phone]

## 2019-03-17 ENCOUNTER — Encounter: Payer: Self-pay | Admitting: Urology

## 2019-03-17 LAB — URINALYSIS, COMPLETE
Bilirubin, UA: NEGATIVE
Glucose, UA: NEGATIVE
Ketones, UA: NEGATIVE
Leukocytes,UA: NEGATIVE
Nitrite, UA: NEGATIVE
Protein,UA: NEGATIVE
Specific Gravity, UA: >1.03 — ABNORMAL HIGH (ref 1.005–1.030)
Urobilinogen, Ur: 0.2 mg/dL (ref 0.2–1.0)
pH, UA: 5.5 (ref 5.0–7.5)

## 2019-03-17 LAB — MICROSCOPIC EXAMINATION
Bacteria, UA: NONE SEEN
RBC: NONE SEEN /hpf (ref 0–2)

## 2019-03-17 MED ORDER — BETAMETHASONE DIPROPIONATE 0.05 % EX CREA: TOPICAL_CREAM | Freq: Two times a day (BID) | CUTANEOUS | 0 refills | Status: AC

## 2019-04-27 ENCOUNTER — Telehealth: Payer: Self-pay | Admitting: Urology

## 2019-04-27 DIAGNOSIS — N471 Phimosis: Secondary | ICD-10-CM

## 2019-04-27 NOTE — Telephone Encounter (Signed)
Patient was seen on 03-16-19 and you were going to speak with a provider at [facility] and get back to the patient. Have you had a chance to do that yet?   Marcelino Duster

## 2019-04-29 NOTE — Telephone Encounter (Signed)
I did speak with Dr. Guy Sandifer at [facility] order entered

## 2019-09-28 ENCOUNTER — Encounter: Payer: Self-pay | Admitting: Urology

## 2019-09-28 ENCOUNTER — Ambulatory Visit (INDEPENDENT_AMBULATORY_CARE_PROVIDER_SITE_OTHER): Payer: BC Managed Care – PPO | Admitting: Urology

## 2019-09-28 ENCOUNTER — Other Ambulatory Visit: Payer: Self-pay

## 2019-09-28 VITALS — BP 127/79 | HR 94 | Ht 67.0 in | Wt 142.9 lb

## 2019-09-28 DIAGNOSIS — N471 Phimosis: Secondary | ICD-10-CM | POA: Diagnosis not present

## 2019-09-28 NOTE — Progress Notes (Signed)
   09/28/2019 5:42 PM   Dalton Allen 08-Feb-1966 [id number]  Referring provider: Preston Fleeting, MD [address] [place],  [place] [address] Chief Complaint  Patient presents with  . Other    HPI: Dalton Allen is a 54 y.o. male who presents today for phimosis.  -He has a 1 year history of phimosis -Initially seen 03/2019 for phimosis however did not want a circumcision and requested a foreskin sparing procedure -Referral was placed to Dr. Guy Sandifer at [facility] however he states that appointment was not made. A repeat referral request was generated from his PCP which is the reason he was scheduled for today's visit.   PMH: Past Medical History:  Diagnosis Date  . Diabetes mellitus without complication (HCC)   . Heart abnormality 2011    Surgical History: Past Surgical History:  Procedure Laterality Date  . NO PAST SURGERIES      Home Medications:  Allergies as of 09/28/2019      Reactions   Beef Allergy    Other reaction(s): Unknown   Pork Allergy    Other reaction(s): Unknown      Medication List    as of September 28, 2019  5:42 PM   You have not been prescribed any medications.     Allergies:  Allergies  Allergen Reactions  . Beef Allergy     Other reaction(s): Unknown  . Pork Allergy     Other reaction(s): Unknown    Family History: Family History  Problem Relation Age of Onset  . Diabetes Mother        Mother's side has +DM history  . Colon cancer Father 29    Social History:  reports that he has never smoked. He has quit using smokeless tobacco. He reports that he does not drink alcohol and does not use drugs.   Physical Exam: BP 127/79   Pulse 94   Ht 5\' 7"  (1.702 m)   Wt 142 lb 14.4 oz (64.8 kg)   BMI 22.38 kg/m   Constitutional:  Alert and oriented, No acute distress. HEENT: [place] AT, moist mucus membranes.  Trachea midline, no masses.   Assessment & Plan:    1. Phimosis -Will resend referral to [facility] with [facility]. [facility] to discuss  foreskin sparing procedure.  I had previously discussed with Dr. Guy Sandifer.  [facility] [address], [address] [place], [place] [phone]

## 2019-09-29 ENCOUNTER — Encounter: Payer: Self-pay | Admitting: Urology

## 2019-09-30 LAB — URINALYSIS, COMPLETE
Bilirubin, UA: NEGATIVE
Glucose, UA: NEGATIVE
Ketones, UA: NEGATIVE
Leukocytes,UA: NEGATIVE
Nitrite, UA: NEGATIVE
Protein,UA: NEGATIVE
Specific Gravity, UA: >1.03 — ABNORMAL HIGH (ref 1.005–1.030)
Urobilinogen, Ur: 0.2 mg/dL (ref 0.2–1.0)
pH, UA: 5 (ref 5.0–7.5)

## 2019-09-30 LAB — MICROSCOPIC EXAMINATION: Bacteria, UA: NONE SEEN
